# Patient Record
Sex: Male | Born: 1980 | Hispanic: Yes | Marital: Single | Smoking: Never smoker
Health system: Southern US, Community
[De-identification: ages and names within clinical notes are randomized; demographics above are authoritative.]

## PROBLEM LIST (undated history)

## (undated) DIAGNOSIS — E119 Type 2 diabetes mellitus without complications: Secondary | ICD-10-CM

## (undated) HISTORY — DX: Type 2 diabetes mellitus without complications: E11.9

---

## 2015-03-25 ENCOUNTER — Emergency Department
Admission: EM | Admit: 2015-03-25 | Discharge: 2015-03-25 | Disposition: A | Discharge status: Home / Self Care | Payer: Managed Care, Other (non HMO) | Attending: Emergency Medicine | Admitting: Emergency Medicine

## 2015-03-25 ENCOUNTER — Encounter: Payer: Self-pay | Admitting: Emergency Medicine

## 2015-03-25 DIAGNOSIS — Y9389 Activity, other specified: Secondary | ICD-10-CM | POA: Diagnosis not present

## 2015-03-25 DIAGNOSIS — S0532XA Ocular laceration without prolapse or loss of intraocular tissue, left eye, initial encounter: Secondary | ICD-10-CM | POA: Diagnosis not present

## 2015-03-25 DIAGNOSIS — W268XXA Contact with other sharp object(s), not elsewhere classified, initial encounter: Secondary | ICD-10-CM | POA: Diagnosis not present

## 2015-03-25 DIAGNOSIS — H1132 Conjunctival hemorrhage, left eye: Secondary | ICD-10-CM | POA: Insufficient documentation

## 2015-03-25 DIAGNOSIS — Y9269 Other specified industrial and construction area as the place of occurrence of the external cause: Secondary | ICD-10-CM | POA: Insufficient documentation

## 2015-03-25 DIAGNOSIS — S0592XA Unspecified injury of left eye and orbit, initial encounter: Secondary | ICD-10-CM | POA: Diagnosis present

## 2015-03-25 DIAGNOSIS — Y990 Civilian activity done for income or pay: Secondary | ICD-10-CM | POA: Insufficient documentation

## 2015-03-25 MED ORDER — FLUORESCEIN SODIUM 1 MG OP STRP
1.0000 | ORAL_STRIP | Freq: Once | OPHTHALMIC | Status: AC
  Administered 2015-03-25: 21:00:00 via OPHTHALMIC
  Filled 2015-03-25: qty 1

## 2015-03-25 MED ORDER — EYE WASH OPHTH SOLN
2.0000 [drp] | OPHTHALMIC | Status: DC | PRN
  Administered 2015-03-25: 2 [drp] via OPHTHALMIC
  Filled 2015-03-25: qty 118

## 2015-03-25 MED ORDER — TETRACAINE HCL 0.5 % OP SOLN
1.0000 [drp] | Freq: Once | OPHTHALMIC | Status: AC
  Administered 2015-03-25: 1 [drp] via OPHTHALMIC
  Filled 2015-03-25: qty 2

## 2015-03-25 MED ORDER — ERYTHROMYCIN 5 MG/GM OP OINT
1.0000 "application " | TOPICAL_OINTMENT | Freq: Four times a day (QID) | OPHTHALMIC | Status: DC
  Administered 2015-03-25: 1 via OPHTHALMIC
  Filled 2015-03-25: qty 1

## 2015-03-25 MED ORDER — ERYTHROMYCIN 5 MG/GM OP OINT: 1.0000 "application " | TOPICAL_OINTMENT | Freq: Four times a day (QID) | OPHTHALMIC | Status: DC

## 2015-03-25 NOTE — Discharge Instructions (Signed)
Hemorragia subconjuntival (Subconjunctival Hemorrhage) La hemorragia subconjuntival es el sangrado que se produce entre la parte blanca del ojo (esclertica) y la membrana transparente que recubre la parte externa de este rgano (conjuntiva). Cerca de la superficie del ojo hay muchos vasos sanguneos diminutos. La hemorragia subconjuntival ocurre cuando uno o ms de estos vasos sanguneos se rompen y sangran, lo que deriva en la aparicin de una mancha roja en el ojo. Esta es similar a un hematoma. En funcin de la magnitud del sangrado, la mancha roja puede cubrir nicamente una pequea zona del ojo o la totalidad de la parte visible de la esclertica. Si se acumula mucha sangre debajo de la conjuntiva, tambin puede haber inflamacin. Las hemorragias subconjuntivales no afectan la visin ni causan dolor, pero puede haber sensacin de irritacin ocular si hay inflamacin. Generalmente, las hemorragias subconjuntivales no requieren tratamiento y desaparecen solas en el trmino de dos semanas. CAUSAS Esta afeccin puede ser causada por lo siguiente:  Un traumatismo leve, como frotarse los ojos con mucha fuerza.  Un traumatismo grave o una contusin.  Toser, estornudar o vomitar.  Realizar esfuerzos, como ocurre al levantar un objeto pesado.  Hipertensin arterial.  Una ciruga ocular reciente.  Antecedentes de diabetes.  Algunos medicamentos, especialmente los anticoagulantes.  Otras afecciones, como los tumores en los ojos, los trastornos hemorrgicos o las anomalas de los vasos sanguneos. Las hemorragias subconjuntivales pueden producirse sin una causa aparente.  SNTOMAS  Los sntomas de esta afeccin incluyen lo siguiente:  Una mancha de color rojo oscuro o brillante en la parte blanca del ojo.  La zona enrojecida se puede extender hasta cubrir un rea ms grande del ojo antes de desaparecer.  La zona enrojecida puede tornarse de color marrn amarillento antes de  desaparecer.  Hinchazn.  Irritacin leve del ojo. DIAGNSTICO Esta afeccin se diagnostica mediante un examen fsico. Si la hemorragia subconjuntival fue causada por un traumatismo, el mdico puede derivarlo a un oculista (oftalmlogo) o a otro especialista para que lo examinen en busca de otras lesiones. Pueden hacerle otros estudios, por ejemplo:  Un examen ocular.  Un control de la presin arterial.  Anlisis de sangre para detectar la presencia de trastornos hemorrgicos. Si la hemorragia subconjuntival fue causada por un traumatismo, pueden hacerle radiografas o una tomografa computarizada (TC) para determinar si hay otras lesiones. TRATAMIENTO Por lo general, no se necesita tratamiento. El mdico puede recomendarle que se aplique gotas oftlmicas o compresas fras para aliviar las molestias. INSTRUCCIONES PARA EL CUIDADO EN EL HOGAR  Tome los medicamentos de venta libre y los recetados solamente como se lo haya indicado el mdico.  Aplique las gotas oftlmicas o las compresas fras para aliviar las molestias como se lo haya indicado el mdico.  Evite las actividades, las cosas y los entornos que pueden causarle irritacin o lesiones en el ojo.  Concurra a todas las visitas de control como se lo haya indicado el mdico. Esto es importante. SOLICITE ATENCIN MDICA SI:  Siente dolor en el ojo.  El sangrado no desaparece en el trmino de 3semanas.  Sigue teniendo hemorragias subconjuntivales. SOLICITE ATENCIN MDICA DE INMEDIATO SI:  Tiene cambios en la visin o dificultad para ver.  Repentinamente, tiene mucha sensibilidad a la luz.  Tiene dolor de cabeza intenso, vmitos persistentes, confusin o un cansancio que no es normal (letargia).  Parece que el ojo sobresale o se protruye de la rbita.  Le aparecen hematomas en el cuerpo sin motivo.  Tiene sangrado en otra parte del cuerpo sin   motivo.   Esta informacin no tiene Theme park manager el consejo del  mdico. Asegrese de hacerle al mdico cualquier pregunta que tenga.   Document Released: 11/20/2004 Document Revised: 11/01/2014 Elsevier Interactive Patient Education [employer]  Use the prescription eye ointment 4 times a day (every 6 hours) as directed. Call [facility] in the morning to schedule an emergency department follow-up. Take Tylenol or Motrin as needed for pain relief. Wear sunglasses to protect from sunlight. Wear safety glasses at work to prevent eye injury.  ================= Utilice la crema antibiotica para ojos 4 veces al da (cada 6 horas) segn las indicaciones. Llame a [employer] por la maana para programar un seguimiento del departamento de Sports administrator. Tomar Tylenol o Motrin segn sea necesario para el alivio del dolor. Use gafas de sol para protegerse de Secretary/administrator. Use anteojos de seguridad en el trabajo para prevenir lesiones oculares.

## 2015-03-25 NOTE — ED Notes (Signed)
Visual acuities:  OD: 20/30  OS:  20/30

## 2015-03-25 NOTE — ED Provider Notes (Signed)
[facility] Emergency Department Provider Note ____________________________________________  Time seen: 1944  I have reviewed the triage vital signs and the nursing notes.  HISTORY  Chief Complaint  Eye Injury  History limited by Spanish language. Interpreter Markus Daft) present during interview and exam.   HPI Rodney Mccann is a 35 y.o. male visit the ED for evaluation of injury sustained to his left eye this morning. 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 He describes that the nail bounced out of the wood that he was hammering into, and hit him in the outside corner of the left eye. He does admit that he was not wearing his safety glasses at the time of the accident. Since that time he's had redness to the left eye and did note some blood tinged tears this morning. He reports pain and foreign body sensation to the lateral aspect of the left eye. He denies any significant vision change, nausea, vomiting, or dizziness.He describes left eye discomfort at an 8/10 in triage.  History reviewed. No pertinent past medical history.  There are no active problems to display for this patient.  History reviewed. No pertinent past surgical history.  Current Outpatient Rx  Name  Route  Sig  Dispense  Refill  . erythromycin ophthalmic ointment   Right Eye   Place 1 application into the right eye 4 (four) times daily.   3.5 g   0    Allergies Review of patient's allergies indicates not on file.  No family history on file.  Social History Social History  Substance Use Topics  . Smoking status: Never Smoker   . Smokeless tobacco: Never Used  . Alcohol Use: No   Review of Systems  Constitutional: Negative for fever. Eyes: Negative for visual changes. Left eye foreign body sensation.  ENT: Negative for sore throat. Cardiovascular: Negative for chest pain. Respiratory: Negative for shortness of  breath. Gastrointestinal: Negative for abdominal pain, vomiting and diarrhea. Genitourinary: Negative for dysuria. Musculoskeletal: Negative for back pain. Skin: Negative for rash. Neurological: Negative for headaches, focal weakness or numbness. ____________________________________________  PHYSICAL EXAM:  VITAL SIGNS: ED Triage Vitals  Enc Vitals Group     BP 03/25/15 1938 146/105 mmHg     Pulse Rate 03/25/15 1938 89     Resp 03/25/15 1938 18     Temp 03/25/15 1938 97.7 F (36.5 C)     Temp Source 03/25/15 1938 Oral     SpO2 03/25/15 1938 98 %     Weight 03/25/15 1938 159 lb (72.122 kg)     Height 03/25/15 1938  (1.626 m)     Head Cir --      Peak Flow --      Pain Score 03/25/15 1939 8     Pain Loc --      Pain Edu? --      Excl. in GC? --    Constitutional: Alert and oriented. Well appearing and in no distress. Head: Normocephalic and atraumatic.      Eyes: Conjunctivae are injected on the left. PERRL. Normal extraocular movements bilaterally. Normal funduscopic exam on the left. Left eye with an obvious lateral subconjunctival hemorrhage on exam. Patient with some dried blood noted to the upper or lower lids. There is dye uptake to the lateral limbic border at about the 3:00 position. This dye uptake falls in a curvilinear pattern following the limbus. There is no dye uptake overlying the cornea.  There is no appreciable waterfall sign.       Ears: Canals clear. TMs intact bilaterally.   Nose: No congestion/rhinorrhea.   Mouth/Throat: Mucous membranes are moist.   Neck: Supple. No thyromegaly. Hematological/Lymphatic/Immunological: No cervical lymphadenopathy. Cardiovascular: Normal rate, regular rhythm.  Respiratory: Normal respiratory effort. Musculoskeletal: Nontender with normal range of motion in all extremities.  Neurologic:  Normal gait without ataxia. Normal speech and language. No gross focal neurologic deficits are appreciated. Skin:  Skin is warm,  dry and intact. No rash noted. Psychiatric: Mood and affect are normal. Patient exhibits appropriate insight and judgment. ____________________________________________  PROCEDURES  Visual Acuity  Right Eye Distance:  20/30 Left Eye Distance:  20/30 ____________________________________________  INITIAL IMPRESSION / ASSESSMENT AND PLAN / ED COURSE  Patient with an acute left corneal laceration and some conjunctival hemorrhage.   I spoke with Dr. Marcy Panning, she agrees with the treatment plan utilizing erythromycin ointment and close follow-up tomorrow in the office.  Patient is discharged with eye injury precautions including use of sunglasses and protective eyewear for any work activities. He is advised to follow with [facility] tomorrow morning for recheck as discussed. He dose Tylenol or ibuprofen over-the-counter for intermittent pain relief. ____________________________________________  FINAL CLINICAL IMPRESSION(S) / ED DIAGNOSES  Final diagnoses:  Corneal laceration of left eye, initial encounter  Subconjunctival hematoma, left      Lissa Hoard, PA-C 03/25/15 2044  Jennye Moccasin, MD 03/25/15 2056

## 2015-03-25 NOTE — ED Notes (Signed)
AAOx3.  Skin warm and dry.  NAD 

## 2015-03-25 NOTE — ED Notes (Signed)
AAOx3.  Skin warm and dry.   

## 2015-03-25 NOTE — ED Notes (Signed)
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  Sclera of left eye reddened.  Good ocular movement and equal with Right.  C/o pain to left eye

## 2015-05-20 ENCOUNTER — Emergency Department
Admission: EM | Admit: 2015-05-20 | Discharge: 2015-05-20 | Disposition: A | Discharge status: Home / Self Care | Payer: Managed Care, Other (non HMO) | Attending: Emergency Medicine | Admitting: Emergency Medicine

## 2015-05-20 ENCOUNTER — Encounter: Payer: Self-pay | Admitting: *Deleted

## 2015-05-20 DIAGNOSIS — R531 Weakness: Secondary | ICD-10-CM | POA: Insufficient documentation

## 2015-05-20 LAB — BASIC METABOLIC PANEL
Anion gap: 4 — ABNORMAL LOW (ref 5–15)
BUN: 13 mg/dL (ref 6–20)
CHLORIDE: 103 mmol/L (ref 101–111)
CO2: 29 mmol/L (ref 22–32)
Calcium: 8.9 mg/dL (ref 8.9–10.3)
Creatinine, Ser: 0.76 mg/dL (ref 0.61–1.24)
GFR calc Af Amer: >60 mL/min (ref >60)
GFR calc non Af Amer: >60 mL/min (ref >60)
GLUCOSE: 100 mg/dL — AB (ref 65–99)
POTASSIUM: 3.7 mmol/L (ref 3.5–5.1)
Sodium: 136 mmol/L (ref 135–145)

## 2015-05-20 LAB — URINALYSIS COMPLETE WITH MICROSCOPIC (ARMC ONLY)
BACTERIA UA: NONE SEEN
Bilirubin Urine: NEGATIVE
Glucose, UA: NEGATIVE mg/dL
HGB URINE DIPSTICK: NEGATIVE
Ketones, ur: NEGATIVE mg/dL
LEUKOCYTES UA: NEGATIVE
NITRITE: NEGATIVE
PH: 6 (ref 5.0–8.0)
Protein, ur: NEGATIVE mg/dL
RBC / HPF: NONE SEEN RBC/hpf (ref 0–5)
Specific Gravity, Urine: 1.001 — ABNORMAL LOW (ref 1.005–1.030)
Squamous Epithelial / LPF: NONE SEEN
WBC UA: NONE SEEN WBC/hpf (ref 0–5)

## 2015-05-20 LAB — CBC
HEMATOCRIT: 43.3 % (ref 40.0–52.0)
HEMOGLOBIN: 15.1 g/dL (ref 13.0–18.0)
MCH: 30.4 pg (ref 26.0–34.0)
MCHC: 34.9 g/dL (ref 32.0–36.0)
MCV: 87.2 fL (ref 80.0–100.0)
Platelets: 204 10*3/uL (ref 150–440)
RBC: 4.97 MIL/uL (ref 4.40–5.90)
RDW: 13 % (ref 11.5–14.5)
WBC: 9 10*3/uL (ref 3.8–10.6)

## 2015-05-20 MED ORDER — SODIUM CHLORIDE 0.9 % IV BOLUS (SEPSIS)
1000.0000 mL | Freq: Once | INTRAVENOUS | Status: AC
  Administered 2015-05-20: 1000 mL via INTRAVENOUS

## 2015-05-20 NOTE — ED Provider Notes (Signed)
[facility] Emergency Department Provider Note  ____________________________________________  Time seen: Approximately 4:03 AM  I have reviewed the triage vital signs and the nursing notes.   HISTORY  Chief Complaint Weakness  History obtained via Spanish interpreter  HPI Solmon Bohr is a 35 y.o. male who presents to the ED from home with a chief complaint of weakness and feeling lightheaded. Patient denies past medical history, had several teeth extracted 2 days ago, is taking ibuprofen and Vicodin for his pain. Reports approximately midnight he was laying in his bed and felt lightheaded as if he were going to pass out. States he got up and tried exercise, drink water and juice. Overall he is feeling better now compared to prior to arrival. Denies recent fever, chills, chest pain, shortness of breath, abdominal pain, nausea, vomiting, diarrhea. Denies recent travel or trauma.   Past medical history None  There are no active problems to display for this patient.   History reviewed. No pertinent past surgical history.  Current Outpatient Rx  Name  Route  Sig  Dispense  Refill  . erythromycin ophthalmic ointment   Right Eye   Place 1 application into the right eye 4 (four) times daily.   3.5 g   0     Allergies Review of patient's allergies indicates no known allergies.  History reviewed. No pertinent family history.  Social History Social History  Substance Use Topics  . Smoking status: Never Smoker   . Smokeless tobacco: Never Used  . Alcohol Use: Yes     Comment: occasionally    Review of Systems  Constitutional: Positive for generalized weakness. No fever/chills. Eyes: No visual changes. ENT: No sore throat. Cardiovascular: Denies chest pain. Respiratory: Denies shortness of breath. Gastrointestinal: No abdominal pain.  No nausea, no vomiting.  No diarrhea.  No constipation. Genitourinary: Negative for  dysuria. Musculoskeletal: Negative for back pain. Skin: Negative for rash. Neurological: Negative for headaches, focal weakness or numbness.  10-point ROS otherwise negative.  ____________________________________________   PHYSICAL EXAM:  VITAL SIGNS: ED Triage Vitals  Enc Vitals Group     BP 05/20/15 0126 141/83 mmHg     Pulse Rate 05/20/15 0126 83     Resp 05/20/15 0126 20     Temp 05/20/15 0126 98.4 F (36.9 C)     Temp Source 05/20/15 0126 Oral     SpO2 05/20/15 0126 96 %     Weight 05/20/15 0126 162 lb (73.483 kg)     Height 05/20/15 0126  (1.626 m)     Head Cir --      Peak Flow --      Pain Score 05/20/15 0127 0     Pain Loc --      Pain Edu? --      Excl. in GC? --     Constitutional: Alert and oriented. Well appearing and in no acute distress. Eyes: Conjunctivae are normal. PERRL. EOMI. Head: Atraumatic. Nose: No congestion/rhinnorhea. Mouth/Throat: Mucous membranes are moist.  Oropharynx non-erythematous.  No intraoral/extraoral swelling or gum abscess. Neck: No stridor.  No carotid bruits. Cardiovascular: Normal rate, regular rhythm. Grossly normal heart sounds.  Good peripheral circulation. Respiratory: Normal respiratory effort.  No retractions. Lungs CTAB. Gastrointestinal: Soft and nontender. No distention. No abdominal bruits. No CVA tenderness. Musculoskeletal: No lower extremity tenderness nor edema.  No joint effusions. Neurologic:  Normal speech and language. No gross focal neurologic deficits are appreciated. No gait instability. Skin:  Skin is warm, dry  and intact. No rash noted. Psychiatric: Mood and affect are normal. Speech and behavior are normal.  ____________________________________________   LABS (all labs ordered are listed, but only abnormal results are displayed)  Labs Reviewed  BASIC METABOLIC PANEL - Abnormal; Notable for the following:    Glucose, Bld 100 (*)    Anion gap 4 (*)    All other components within normal limits   URINALYSIS COMPLETEWITH MICROSCOPIC ([facility] ONLY) - Abnormal; Notable for the following:    Color, Urine COLORLESS (*)    APPearance CLEAR (*)    Specific Gravity, Urine 1.001 (*)    All other components within normal limits  CBC  CBG MONITORING, ED   ____________________________________________  EKG  ED ECG REPORT I, SUNG,JADE J, the attending physician, personally viewed and interpreted this ECG.   Date: 05/20/2015  EKG Time: 0143  Rate: 77  Rhythm: normal EKG, normal sinus rhythm  Axis: Normal  Intervals:none  ST&T Change: Nonspecific  ____________________________________________  RADIOLOGY  None ____________________________________________   PROCEDURES  Procedure(s) performed: None  Critical Care performed: No  ____________________________________________   INITIAL IMPRESSION / ASSESSMENT AND PLAN / ED COURSE  Pertinent labs & imaging results that were available during my care of the patient were reviewed by me and considered in my medical decision making (see chart for details).  35 year old male otherwise healthy male who presents with generalized weakness. No focal neurological deficits on exam. Orthostatics noted. His lips appear a little dry, and in the setting of recent dental extraction, he is likely a little volume depleted. Will infuse 1 L IV normal saline.  ----------------------------------------- 6:37 AM on 05/20/2015 -----------------------------------------  Patient sleeping in no acute distress. IV fluids completed. Overall feels much better. Strict return precautions given. Patient verbalizes understanding and agrees with plan of care. ____________________________________________   FINAL CLINICAL IMPRESSION(S) / ED DIAGNOSES  Final diagnoses:  Weakness      Irean HongJade J Sung, MD 05/20/15 [phone]

## 2015-05-20 NOTE — ED Notes (Signed)
Pt states he felt weak while he was laying in bed, as if he were going to pass out x 1 hr ago. Pt states he got up and tried to exercise, drank water, and juice. Pt states he is feeling better now. Pt had several teeth pulled on Thursday and is taking ibuprofen and vicoden for his pain. Pt states he has felt this way before x 2 months ago, did not seek medical care at that time. Pt states he lost his balance and strength.

## 2015-05-20 NOTE — Discharge Instructions (Signed)
1. Drink plenty of fluids daily. 2. If your pain is not severe, I recommend you taking Tylenol only for dental discomfort. 3. Return to the ER for worsening symptoms, persistent vomiting, difficulty breathing or other concerns.  Debilidad  (Weakness)  La debilidad es la falta de energa. Se puede sentir en todo el cuerpo (generalizada) o en una parte especfica del cuerpo (focalizada).Verner Chol. Algunas causas de la debilidad pueden ser graves. Es posible que necesite una evaluacin mdica ms profunda, especialmente si usted es anciano o tiene historia de inmunosupresin (como quimioterapia o VIH), enfermedad renal, enfermedad cardiaca o diabetes. CAUSAS  La debilidad puede deberse a muchas afecciones diferentes:   Infecciones.  Cansancio fsico extremo.  Hemorragia interna u otras prdidas de sangre que causa un dficit de glbulos rojos (anemia).  Deshidratacin. Esta causa es ms frecuente en personas mayores.  Efectos secundarios o anormalidades electrolticas por medicamentos, como analgsicos o sedantes.  Estrs emocional, ansiedad o depresin.  Problemas de circulacin, especialmente enfermedad arterial perifrica grave.  Enfermedades cardacas, como la fibrilacin auricular rpida, la bradicardia o la insuficiencia cardaca.  Los trastornos del [address] Stre[address]istema nervioso, como el sndrome de [place]Guillain-Barr, esclerosis mltiple o ictus. DIAGNSTICO  Para hallar la causa de la debilidad, el mdico le har una historia clnica y un examen fsico. Si es necesario, le indicarn pruebas de laboratorio o Recruitment consultantradiografas.  TRATAMIENTO  El tratamiento de la debilidad depende de la causa de sus sntomas y puede Electrical engineervariar mucho.  INSTRUCCIONES PARA EL CUIDADO EN EL HOGAR   Descanse todo lo que sea necesario.  Consuma una dieta bien balanceada.  Trate de Materials engineerhacer ejercicio CarMaxtodos los das.  Tome slo medicamentos de venta libre o recetados, segn las indicaciones del mdico. SOLICITE ATENCIN MDICA SI:    La debilidad parece empeorar o se extiende a otras partes del cuerpo.  Siente nuevos dolores o dolencias. SOLICITE ATENCIN MDICA DE INMEDIATO SI:   No puede realizar sus actividades normales diarias, como vestirse y alimentarse.  No puede subir y Architectural technologistbajar escaleras o se siente agotado cuando lo hace.  Tiene dificultad para respirar o le duele pecho.  Tiene dificultad para mover partes del cuerpo.  Siente debilidad en una sola zona del cuerpo o solo en un lado del cuerpo.  Tiene fiebre.  Tiene problemas para hablar o tragar.  No puede controlar la vejiga o los movimientos intestinales.  La materia fecal o el vmito son negros o Investment banker, operationaltienen sangre. ASEGRESE DE QUE:   Comprende estas instrucciones.  Controlar su enfermedad.  Solicitar ayuda de inmediato si no mejora o si empeora.   Esta informacin no tiene Theme park managercomo fin reemplazar el consejo del mdico. Asegrese de hacerle al mdico cualquier pregunta que tenga.   Document Released: 03/24/2006 Document Revised: 08/12/2011 Elsevier Interactive Patient Education [employer] Elsevier Inc.

## 2015-07-03 LAB — HM HIV SCREENING LAB: HM HIV Screening: NEGATIVE

## 2015-11-26 DIAGNOSIS — L210 Seborrhea capitis: Secondary | ICD-10-CM | POA: Insufficient documentation

## 2015-11-26 DIAGNOSIS — E781 Pure hyperglyceridemia: Secondary | ICD-10-CM | POA: Insufficient documentation

## 2017-09-22 ENCOUNTER — Ambulatory Visit (HOSPITAL_COMMUNITY)
Admission: EM | Admit: 2017-09-22 | Discharge: 2017-09-22 | Discharge status: Absent without leave | Payer: Managed Care, Other (non HMO)

## 2017-10-26 ENCOUNTER — Other Ambulatory Visit: Payer: Self-pay

## 2017-10-26 ENCOUNTER — Emergency Department: Payer: 59

## 2017-10-26 ENCOUNTER — Emergency Department
Admission: EM | Admit: 2017-10-26 | Discharge: 2017-10-26 | Disposition: A | Discharge status: Home / Self Care | Payer: 59 | Attending: Emergency Medicine | Admitting: Emergency Medicine

## 2017-10-26 DIAGNOSIS — R1310 Dysphagia, unspecified: Secondary | ICD-10-CM

## 2017-10-26 DIAGNOSIS — J392 Other diseases of pharynx: Secondary | ICD-10-CM

## 2017-10-26 DIAGNOSIS — R682 Dry mouth, unspecified: Secondary | ICD-10-CM | POA: Insufficient documentation

## 2017-10-26 LAB — GLUCOSE, CAPILLARY: GLUCOSE-CAPILLARY: 111 mg/dL — AB (ref 70–99)

## 2017-10-26 LAB — GROUP A STREP BY PCR: GROUP A STREP BY PCR: NOT DETECTED

## 2017-10-26 IMAGING — CR DG NECK SOFT TISSUE
1 series · 2 of 2 positions shown · non-contrast
Comparison: None.

CLINICAL DATA: Dry mouth, unable to swallow.

EXAM:
NECK SOFT TISSUES - 1+ VIEW

[Series 1: dg neck soft tissue · 0.14mm/px · 2 of 2 slices shown]
[im 1/2]
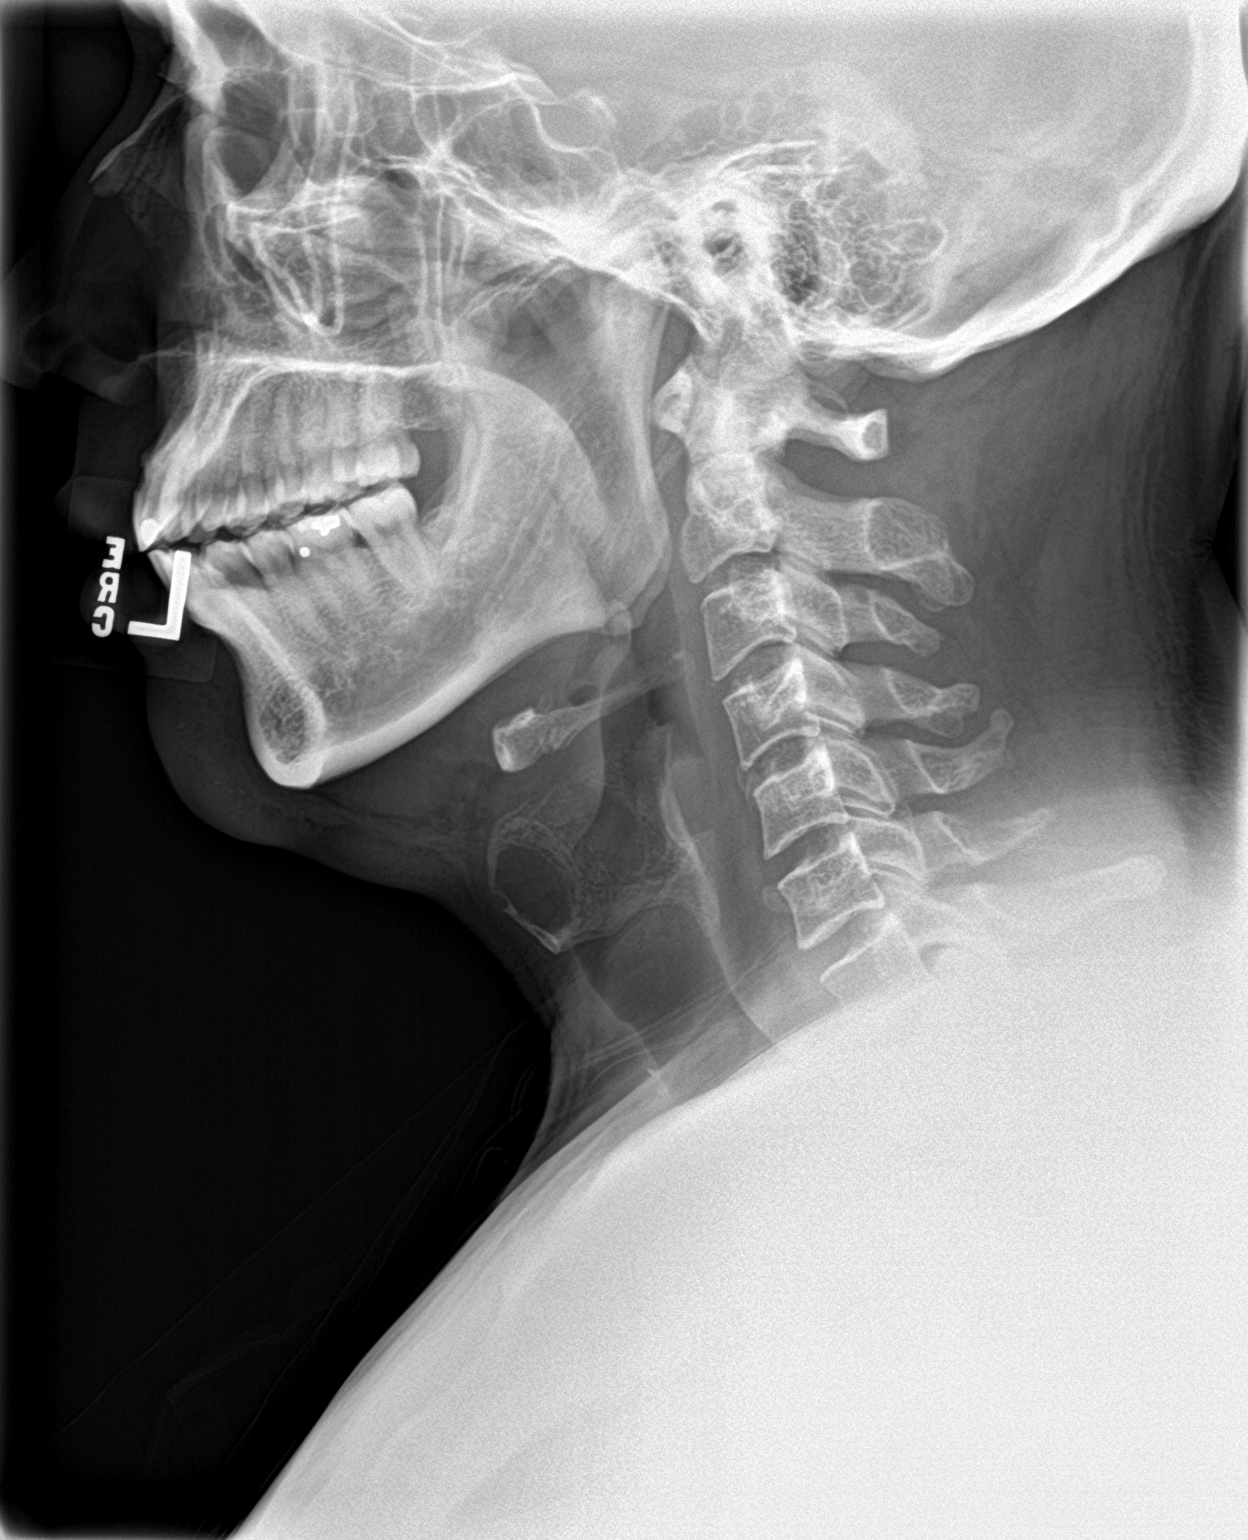
[im 2/2]
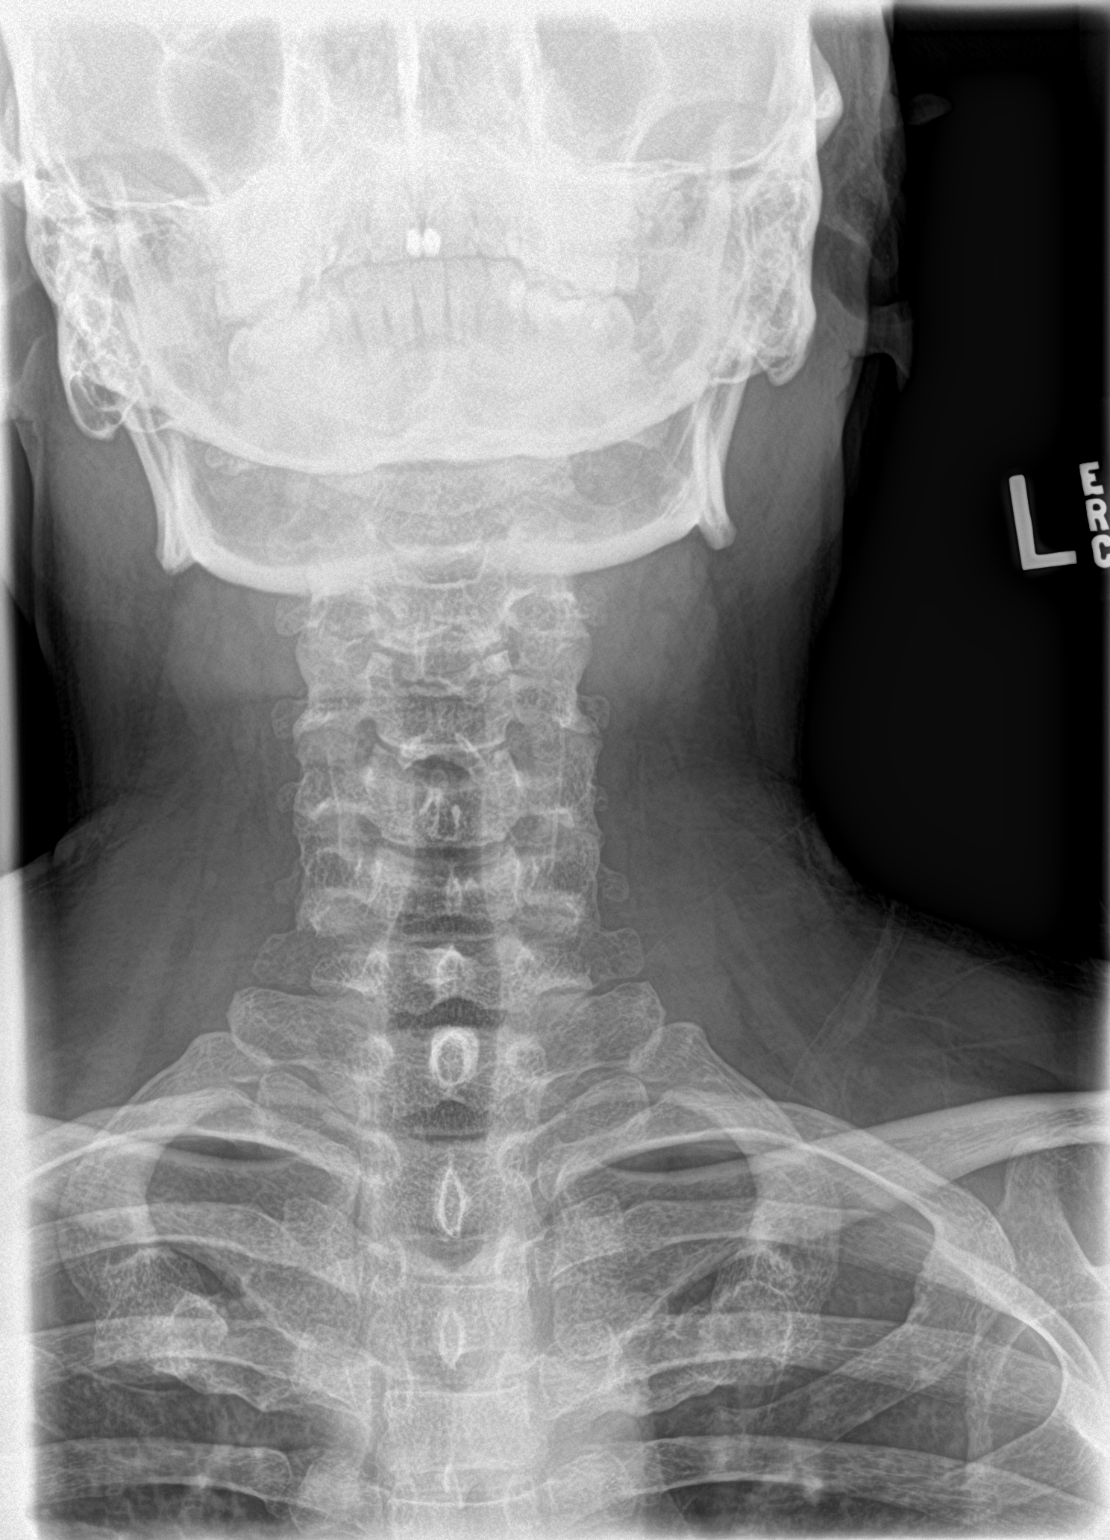

[2 of 2 positions shown; findings below may reference images not displayed]

FINDINGS: There is no evidence of retropharyngeal soft tissue swelling or
epiglottic enlargement. Prominent submandibular fat planes without
subcutaneous emphysema. The cervical airway is unremarkable and no
radio-opaque foreign body identified.
IMPRESSION: Negative.

## 2017-10-26 NOTE — ED Triage Notes (Signed)
Info obtained via [employer], [place] [phone]).    Patient reports was at friends house tonight and the ambulance was called because his mouth went dry and couldn't swallow and then panicked and was afraid I might die.  Patient is speaking to interpreter in complete sentences without difficulty.  Patient reports being afraid to sleep because afraid he will choke.

## 2017-10-26 NOTE — ED Provider Notes (Signed)
[facility] Emergency Department Provider Note  ____________________________________________   First MD Initiated Contact with Patient 10/26/17 [phone]     (approximate)  I have reviewed the triage vital signs and the nursing notes.   HISTORY  Chief Complaint No chief complaint on file.  The patient and/or family speak(s) Spanish.  They understand they have the right to the use of a hospital interpreter, however at this time they prefer to speak directly with me in Spanish.  They know that they can ask for an interpreter at any time.   HPI Rodney Mccann is a 37 y.o. male with no chronic medical issues who presents for evaluation of intermittent feelings of difficulty swallowing and dry mouth and throat over the last several months.  He states that nothing in particular makes his symptoms better or worse.  He states that sometimes for no apparent reason he feels like he cannot swallow.  This happens after he is doing his job in [employer] but it can also happen at random at night.  He feels no swelling.  It does not seem to matter if he drinks plenty of fluids or not but when the symptom is present he feels like he cannot drink.  He does not have any difficulty speaking or breathing.  I makes him very afraid because he feels like he might die.  He reports that it happened earlier tonight so that is why he came to the emergency department.  He is asymptomatic at this time.  He does state that he went to [facility] and saw a doctor to have a general health check but no specific issues were identified.  He denies headache, neck pain, sore throat, nausea, vomiting, chest pain, shortness of breath, abdominal pain, and dysuria.  No past medical history on file.  There are no active problems to display for this patient.   No past surgical history on file.  Prior to Admission medications   Medication Sig Start Date End Date Taking? Authorizing Provider    erythromycin ophthalmic ointment Place 1 application into the right eye 4 (four) times daily. 03/25/15   Menshew, Charlesetta Ivory, PA-C    Allergies Patient has no known allergies.  No family history on file.  Social History Social History   Tobacco Use  . Smoking status: Never Smoker  . Smokeless tobacco: Never Used  Substance Use Topics  . Alcohol use: Yes    Comment: occasionally  . Drug use: No    Review of Systems Constitutional: No fever/chills Eyes: No visual changes. ENT: Feelings of dry throat and difficulty swallowing intermittently over the last month as described above.  No swelling or sensation of sore throat. Cardiovascular: Denies chest pain. Respiratory: Denies shortness of breath. Gastrointestinal: No abdominal pain.  No nausea, no vomiting.  No diarrhea.  No constipation. Genitourinary: Negative for dysuria. Musculoskeletal: Negative for neck pain.  Negative for back pain. Integumentary: Negative for rash. Neurological: Negative for headaches, focal weakness or numbness.   ____________________________________________   PHYSICAL EXAM:  VITAL SIGNS: ED Triage Vitals  Enc Vitals Group     BP 10/26/17 0148 (!) 138/91     Pulse Rate 10/26/17 0148 70     Resp 10/26/17 0148 19     Temp 10/26/17 0148 98.6 F (37 C)     Temp Source 10/26/17 0148 Oral     SpO2 10/26/17 0148 97 %     Weight 10/26/17 0149 70.8 kg (156 lb)  Height 10/26/17 0149 1.626 m (5\' 4" )     Head Circumference --      Peak Flow --      Pain Score 10/26/17 0148 0     Pain Loc --      Pain Edu? --      Excl. in [place]? --     Constitutional: Alert and oriented. Well appearing and in no acute distress. Eyes: Conjunctivae are normal.  Head: Atraumatic. Nose: No congestion/rhinnorhea. Mouth/Throat: Mucous membranes are moist.  Oropharynx non-erythematous. Neck: No stridor.  No meningeal signs.  No cervical spine tenderness to palpation.  No palpable lymphadenopathy. Cardiovascular:  Normal rate, regular rhythm. Good peripheral circulation. Grossly normal heart sounds. Respiratory: Normal respiratory effort.  No retractions. Lungs CTAB. Gastrointestinal: Soft and nontender. No distention.  Musculoskeletal: No lower extremity tenderness nor edema. No gross deformities of extremities. Neurologic:  Normal speech and language. No gross focal neurologic deficits are appreciated.  Skin:  Skin is warm, dry and intact. No rash noted. Psychiatric: Mood and affect are a little bit anxious but generally unremarkable.  ____________________________________________   LABS (all labs ordered are listed, but only abnormal results are displayed)  Labs Reviewed  GLUCOSE, CAPILLARY - Abnormal; Notable for the following components:      Result Value   Glucose-Capillary 111 (*)    All other components within normal limits  GROUP A STREP BY PCR  CBG MONITORING, ED   ____________________________________________  EKG  None - EKG not ordered by ED physician ____________________________________________  RADIOLOGY Marylou Mccoy, personally viewed and evaluated these images (plain radiographs) as part of my medical decision making, as well as reviewing the written report by the radiologist.  ED MD interpretation:  No acute abnormalities  Official radiology report(s): Dg Neck Soft Tissue  Result Date: 10/26/2017 CLINICAL DATA:  Dry mouth, unable to swallow. EXAM: NECK SOFT TISSUES - 1+ VIEW COMPARISON:  None. FINDINGS: There is no evidence of retropharyngeal soft tissue swelling or epiglottic enlargement. Prominent submandibular fat planes without subcutaneous emphysema. The cervical airway is unremarkable and no radio-opaque foreign body identified. IMPRESSION: Negative. Electronically Signed   By: Awilda Metro M.D.   On: 10/26/2017 03:49    ____________________________________________   PROCEDURES  Critical Care performed: No   Procedure(s) performed:    Procedures   ____________________________________________   INITIAL IMPRESSION / ASSESSMENT AND PLAN / ED COURSE  As part of my medical decision making, I reviewed the following data within the electronic MEDICAL RECORD NUMBER Nursing notes reviewed and incorporated, Labs reviewed  and Radiograph reviewed     Differential diagnosis includes, but is not limited to, panic attack/anxiety, pharyngitis, esophageal web, achalasia.  However by far I think that panic attacks and anxiety are the most likely reason he is having difficulties.  I checked a finger stick blood sugar to make sure that he does not have hyperglycemia and it was within normal limits.  Radiographs of the neck did not demonstrate any structural abnormalities or swelling, and the strep swab was negative.  I provided reassurance but explained to the patient that I am unlikely to be able to diagnose an abnormality such as this that is been going on for several months here in the emergency department.  I encourage close outpatient follow-up with ENT who might have additional ideas recommendations.  I gave my usual and customary return precautions.     ____________________________________________  FINAL CLINICAL IMPRESSION(S) / ED DIAGNOSES  Final diagnoses:  Dry throat  Dysphagia, unspecified type  MEDICATIONS GIVEN DURING THIS VISIT:  Medications - No data to display   ED Discharge Orders    None       Note:  This document was prepared using Dragon voice recognition software and may include unintentional dictation errors.    Loleta Rose, MD 10/26/17 [phone]

## 2017-10-26 NOTE — Discharge Instructions (Signed)
Your workup in the Emergency Department today was reassuring.  We did not find any specific abnormalities.  We recommend you drink plenty of fluids, take your regular medications and/or any new ones prescribed today, and follow up with the doctor(s) listed in these documents as recommended.  Return to the Emergency Department if you develop new or worsening symptoms that concern you.  

## 2019-04-13 ENCOUNTER — Ambulatory Visit: Payer: 59

## 2019-04-25 ENCOUNTER — Encounter (HOSPITAL_COMMUNITY): Payer: Self-pay

## 2019-04-25 ENCOUNTER — Ambulatory Visit (HOSPITAL_COMMUNITY)
Admission: EM | Admit: 2019-04-25 | Discharge: 2019-04-25 | Disposition: A | Discharge status: Home / Self Care | Payer: 59 | Attending: Family Medicine | Admitting: Family Medicine

## 2019-04-25 ENCOUNTER — Other Ambulatory Visit: Payer: Self-pay

## 2019-04-25 DIAGNOSIS — L409 Psoriasis, unspecified: Secondary | ICD-10-CM | POA: Diagnosis not present

## 2019-04-25 MED ORDER — TRIAMCINOLONE ACETONIDE 0.1 % EX OINT: 1.0000 "application " | TOPICAL_OINTMENT | Freq: Two times a day (BID) | CUTANEOUS | 1 refills | Status: DC

## 2019-04-25 MED ORDER — CLOBETASOL PROPIONATE 0.05 % EX GEL: CUTANEOUS | 0 refills | Status: AC

## 2019-04-25 NOTE — ED Provider Notes (Signed)
[place]    CSN: [id number] Arrival date & time: 04/25/19  1533      History   Chief Complaint Chief Complaint  Patient presents with  . Eczema    HPI Rodney Mccann is a 39 y.o. male.   HPI  Many years of dry skin in his scalp and behind his ears.  Treated many years ago with cold tar.  He did not like this treatment.  He is here for treatment today.  History reviewed. No pertinent past medical history.  There are no problems to display for this patient.   History reviewed. No pertinent surgical history.     Home Medications    Prior to Admission medications   Medication Sig Start Date End Date Taking? Authorizing Provider  clobetasol (TEMOVATE) 0.05 % GEL Use daily for one week then once a week - for scalp 04/25/19   Raylene Everts, MD  erythromycin ophthalmic ointment Place 1 application into the right eye 4 (four) times daily. 03/25/15   Menshew, Dannielle Karvonen, PA-C  triamcinolone ointment (KENALOG) 0.1 % Apply 1 application topically 2 (two) times daily. 04/25/19   Raylene Everts, MD    Family History Family History  Family history unknown: Yes    Social History Social History   Tobacco Use  . Smoking status: Never Smoker  . Smokeless tobacco: Never Used  Substance Use Topics  . Alcohol use: Yes    Comment: occasionally  . Drug use: No     Allergies   Ibuprofen   Review of Systems Review of Systems  Skin: Positive for rash.     Physical Exam Triage Vital Signs ED Triage Vitals  Enc Vitals Group     BP 04/25/19 1624 (!) 106/59     Pulse Rate 04/25/19 1624 70     Resp 04/25/19 1624 18     Temp 04/25/19 1624 98 F (36.7 C)     Temp Source 04/25/19 1624 Oral     SpO2 04/25/19 1624 97 %     Weight --      Height --      Head Circumference --      Peak Flow --      Pain Score 04/25/19 1627 0     Pain Loc --      Pain Edu? --      Excl. in [place]? --    No data found.  Updated Vital Signs BP (!) 106/59 (BP  Location: Right Arm)   Pulse 70   Temp 98 F (36.7 C) (Oral)   Resp 18   SpO2 97%   Visual Acuity Right Eye Distance:   Left Eye Distance:   Bilateral Distance:    Right Eye Near:   Left Eye Near:    Bilateral Near:     Physical Exam Constitutional:      General: He is not in acute distress.    Appearance: He is well-developed and normal weight.  HENT:     Head: Normocephalic and atraumatic.     Mouth/Throat:     Comments: Mask in place Eyes:     Conjunctiva/sclera: Conjunctivae normal.     Pupils: Pupils are equal, round, and reactive to light.  Cardiovascular:     Rate and Rhythm: Normal rate.  Pulmonary:     Effort: Pulmonary effort is normal. No respiratory distress.  Abdominal:     General: There is no distension.     Palpations: Abdomen is soft.  Musculoskeletal:        General: Normal range of motion.     Cervical back: Normal range of motion.  Skin:    General: Skin is warm and dry.     Comments: Thick silvery scales present behind ears and up into scalp  Neurological:     Mental Status: He is alert.  Psychiatric:        Mood and Affect: Mood normal.        Behavior: Behavior normal.      UC Treatments / Results  Labs (all labs ordered are listed, but only abnormal results are displayed) Labs Reviewed - No data to display  EKG   Radiology No results found.  Procedures Procedures (including critical care time)  Medications Ordered in UC Medications - No data to display  Initial Impression / Assessment and Plan / UC Course  I have reviewed the triage vital signs and the nursing notes.  Pertinent labs & imaging results that were available during my care of the patient were reviewed by me and considered in my medical decision making (see chart for details).     Psoriasis.  Patient needs Derm referral if he fails steroid treatment Final Clinical Impressions(s) / UC Diagnoses   Final diagnoses:  Psoriasis of scalp     Discharge  Instructions     There are 2 prescriptions.  One is an ointment to use behind your ears.  The other is a gel to use on her scalp. The tar shampoo (over-the-counter) are also helpful for the scalp rash See a dermatologist in follow-up Your primary care doctor can refer you   ED Prescriptions    Medication Sig Dispense Auth. Provider   triamcinolone ointment (KENALOG) 0.1 % Apply 1 application topically 2 (two) times daily. 15 g Eustace Moore, MD   clobetasol (TEMOVATE) 0.05 % GEL Use daily for one week then once a week - for scalp 60 g Eustace Moore, MD     PDMP not reviewed this encounter.   Eustace Moore, MD 04/25/19 2039

## 2019-04-25 NOTE — ED Triage Notes (Signed)
Pt presents with  Recurrent dry skin patches behind both ears and scalp on head for over a month.

## 2019-04-25 NOTE — Discharge Instructions (Signed)
There are 2 prescriptions.  One is an ointment to use behind your ears.  The other is a gel to use on her scalp. The tar shampoo (over-the-counter) are also helpful for the scalp rash See a dermatologist in follow-up Your primary care doctor can refer you

## 2019-04-26 ENCOUNTER — Ambulatory Visit (HOSPITAL_COMMUNITY)
Admission: EM | Admit: 2019-04-26 | Discharge: 2019-04-26 | Disposition: A | Discharge status: Home / Self Care | Payer: 59 | Attending: Family Medicine | Admitting: Family Medicine

## 2019-04-26 ENCOUNTER — Encounter (HOSPITAL_COMMUNITY): Payer: Self-pay | Admitting: Emergency Medicine

## 2019-04-26 ENCOUNTER — Other Ambulatory Visit: Payer: Self-pay

## 2019-04-26 DIAGNOSIS — R197 Diarrhea, unspecified: Secondary | ICD-10-CM | POA: Insufficient documentation

## 2019-04-26 DIAGNOSIS — R109 Unspecified abdominal pain: Secondary | ICD-10-CM | POA: Diagnosis not present

## 2019-04-26 DIAGNOSIS — R531 Weakness: Secondary | ICD-10-CM | POA: Diagnosis not present

## 2019-04-26 DIAGNOSIS — Z20822 Contact with and (suspected) exposure to covid-19: Secondary | ICD-10-CM | POA: Insufficient documentation

## 2019-04-26 MED ORDER — LOPERAMIDE HCL 2 MG PO CAPS: 2.0000 mg | ORAL_CAPSULE | Freq: Four times a day (QID) | ORAL | 0 refills | Status: DC | PRN

## 2019-04-26 NOTE — ED Provider Notes (Signed)
[place]    CSN: [id number] Arrival date & time: 04/26/19  1027      History   Chief Complaint Chief Complaint  Patient presents with  . Diarrhea    HPI Rodney Mccann is a 39 y.o. male.   HPI   Patient was well yesterday for much of day.  Had dinner that he cooked at home.  No one else in his home is sick.  Couple hours later he started to have abdominal crampy pain and diarrhea.  Crampy pain and watery diarrhea all night.  No nausea or vomiting.  He states he is having some body aches.  No cough cold runny nose symptoms.  No shortness of breath.  No loss of sense of smell or taste.  No known exposure to Covid. Is able to keep down water.  History reviewed. No pertinent past medical history.  There are no problems to display for this patient.   History reviewed. No pertinent surgical history.     Home Medications    Prior to Admission medications   Medication Sig Start Date End Date Taking? Authorizing Provider  clobetasol (TEMOVATE) 0.05 % GEL Use daily for one week then once a week - for scalp 04/25/19   Raylene Everts, MD  loperamide (IMODIUM) 2 MG capsule Take 1 capsule (2 mg total) by mouth 4 (four) times daily as needed for diarrhea or loose stools. 04/26/19   Raylene Everts, MD  triamcinolone ointment (KENALOG) 0.1 % Apply 1 application topically 2 (two) times daily. Patient not taking: Reported on 04/26/2019 04/25/19   Raylene Everts, MD    Family History Family History  Problem Relation Age of Onset  . Diabetes Mother   . Diabetes Father     Social History Social History   Tobacco Use  . Smoking status: Never Smoker  . Smokeless tobacco: Never Used  Substance Use Topics  . Alcohol use: Yes    Comment: occasionally  . Drug use: No     Allergies   Ibuprofen   Review of Systems Review of Systems  Constitutional: Positive for fatigue. Negative for chills and fever.  Gastrointestinal: Positive for abdominal distention,  abdominal pain and diarrhea. Negative for nausea and vomiting.  Musculoskeletal: Positive for myalgias.     Physical Exam Triage Vital Signs ED Triage Vitals  Enc Vitals Group     BP 04/26/19 0857 124/75     Pulse Rate 04/26/19 0857 93     Resp 04/26/19 0857 (!) 22     Temp 04/26/19 0857 98.5 F (36.9 C)     Temp Source 04/26/19 0857 Oral     SpO2 04/26/19 0857 98 %     Weight --      Height --      Head Circumference --      Peak Flow --      Pain Score 04/26/19 0853 10     Pain Loc --      Pain Edu? --      Excl. in [place]? --    No data found.  Updated Vital Signs BP 124/75 (BP Location: Left Arm)   Pulse 93   Temp 98.5 F (36.9 C) (Oral)   Resp (!) 22   SpO2 98%      Physical Exam Constitutional:      General: He is not in acute distress.    Appearance: Normal appearance. He is well-developed. He is not ill-appearing or toxic-appearing.  HENT:  Head: Normocephalic and atraumatic.     Mouth/Throat:     Comments: Mask in place Eyes:     Conjunctiva/sclera: Conjunctivae normal.     Pupils: Pupils are equal, round, and reactive to light.  Cardiovascular:     Rate and Rhythm: Normal rate and regular rhythm.     Heart sounds: Normal heart sounds.  Pulmonary:     Effort: Pulmonary effort is normal. No respiratory distress.     Breath sounds: Normal breath sounds.  Abdominal:     General: There is no distension.     Palpations: Abdomen is soft.     Tenderness: There is no abdominal tenderness. There is no guarding or rebound.     Comments: Active bowel sounds.  Soft.  No guarding or rebound  Musculoskeletal:        General: Normal range of motion.     Cervical back: Normal range of motion.  Skin:    General: Skin is warm and dry.  Neurological:     Mental Status: He is alert.      UC Treatments / Results  Labs (all labs ordered are listed, but only abnormal results are displayed) Labs Reviewed  NOVEL CORONAVIRUS, NAA (HOSP ORDER, SEND-OUT TO REF  LAB; TAT 18-24 HRS)    EKG   Radiology No results found.  Procedures Procedures (including critical care time)  Medications Ordered in UC Medications - No data to display  Initial Impression / Assessment and Plan / UC Course  I have reviewed the triage vital signs and the nursing notes.  Pertinent labs & imaging results that were available during my care of the patient were reviewed by me and considered in my medical decision making (see chart for details).     Likely viral illness.  Remotely could be Covid.  We will do Covid testing, and give diarrhea instructions.  Return if worse instead of better Final Clinical Impressions(s) / UC Diagnoses   Final diagnoses:  Diarrhea, unspecified type     Discharge Instructions     Take one loperamide when you get the medication Take another if needed in four hours This will help with the pain and diarrhea Lots of fluids Check your lab test result in My Chart Cannot return to work until COVID test is negative    ED Prescriptions    Medication Sig Dispense Auth. Provider   loperamide (IMODIUM) 2 MG capsule Take 1 capsule (2 mg total) by mouth 4 (four) times daily as needed for diarrhea or loose stools. 12 capsule Eustace Moore, MD     PDMP not reviewed this encounter.   Eustace Moore, MD 04/26/19 1018

## 2019-04-26 NOTE — ED Triage Notes (Addendum)
Onset  March 1 of diarrhea.  Diarrhea all night.  Feeling hot.  Today his body sore and weak.  Has had 3-4 episodes of diarrhea since getting up and has  abdominal pain

## 2019-04-26 NOTE — Discharge Instructions (Addendum)
Take one loperamide when you get the medication Take another if needed in four hours This will help with the pain and diarrhea Lots of fluids Check your lab test result in My Chart Cannot return to work until COVID test is negative

## 2019-04-28 ENCOUNTER — Ambulatory Visit: Payer: Managed Care, Other (non HMO) | Admitting: Family Medicine

## 2019-04-28 LAB — NOVEL CORONAVIRUS, NAA (HOSP ORDER, SEND-OUT TO REF LAB; TAT 18-24 HRS): SARS-CoV-2, NAA: NOT DETECTED

## 2019-10-02 ENCOUNTER — Encounter (HOSPITAL_COMMUNITY): Payer: Self-pay

## 2019-10-02 ENCOUNTER — Other Ambulatory Visit: Payer: Self-pay

## 2019-10-02 ENCOUNTER — Emergency Department (HOSPITAL_COMMUNITY)
Admission: EM | Admit: 2019-10-02 | Discharge: 2019-10-02 | Disposition: A | Discharge status: Home / Self Care | Payer: 59 | Attending: Emergency Medicine | Admitting: Emergency Medicine

## 2019-10-02 DIAGNOSIS — R05 Cough: Secondary | ICD-10-CM | POA: Insufficient documentation

## 2019-10-02 DIAGNOSIS — Z79899 Other long term (current) drug therapy: Secondary | ICD-10-CM | POA: Diagnosis not present

## 2019-10-02 DIAGNOSIS — U071 COVID-19: Secondary | ICD-10-CM | POA: Diagnosis not present

## 2019-10-02 DIAGNOSIS — R059 Cough, unspecified: Secondary | ICD-10-CM

## 2019-10-02 DIAGNOSIS — R52 Pain, unspecified: Secondary | ICD-10-CM | POA: Diagnosis present

## 2019-10-02 DIAGNOSIS — M7918 Myalgia, other site: Secondary | ICD-10-CM | POA: Diagnosis not present

## 2019-10-02 DIAGNOSIS — R519 Headache, unspecified: Secondary | ICD-10-CM | POA: Insufficient documentation

## 2019-10-02 DIAGNOSIS — Z20822 Contact with and (suspected) exposure to covid-19: Secondary | ICD-10-CM

## 2019-10-02 DIAGNOSIS — R531 Weakness: Secondary | ICD-10-CM | POA: Diagnosis not present

## 2019-10-02 LAB — SARS CORONAVIRUS 2 BY RT PCR (HOSPITAL ORDER, PERFORMED IN ~~LOC~~ HOSPITAL LAB): SARS Coronavirus 2: POSITIVE — AB

## 2019-10-02 LAB — CBC
HCT: 50.3 % (ref 39.0–52.0)
Hemoglobin: 16.9 g/dL (ref 13.0–17.0)
MCH: 30.6 pg (ref 26.0–34.0)
MCHC: 33.6 g/dL (ref 30.0–36.0)
MCV: 91 fL (ref 80.0–100.0)
Platelets: 155 10*3/uL (ref 150–400)
RBC: 5.53 MIL/uL (ref 4.22–5.81)
RDW: 12 % (ref 11.5–15.5)
WBC: 5.1 10*3/uL (ref 4.0–10.5)
nRBC: 0 % (ref 0.0–0.2)

## 2019-10-02 LAB — COMPREHENSIVE METABOLIC PANEL
ALT: 78 U/L — ABNORMAL HIGH (ref 0–44)
AST: 35 U/L (ref 15–41)
Albumin: 4 g/dL (ref 3.5–5.0)
Alkaline Phosphatase: 96 U/L (ref 38–126)
Anion gap: 12 (ref 5–15)
BUN: 8 mg/dL (ref 6–20)
CO2: 24 mmol/L (ref 22–32)
Calcium: 8.6 mg/dL — ABNORMAL LOW (ref 8.9–10.3)
Chloride: 103 mmol/L (ref 98–111)
Creatinine, Ser: 0.83 mg/dL (ref 0.61–1.24)
GFR calc Af Amer: >60 mL/min (ref >60)
GFR calc non Af Amer: >60 mL/min (ref >60)
Glucose, Bld: 88 mg/dL (ref 70–99)
Potassium: 4.1 mmol/L (ref 3.5–5.1)
Sodium: 139 mmol/L (ref 135–145)
Total Bilirubin: 0.5 mg/dL (ref 0.3–1.2)
Total Protein: 7.5 g/dL (ref 6.5–8.1)

## 2019-10-02 LAB — CK: Total CK: 122 U/L (ref 49–397)

## 2019-10-02 MED ORDER — SODIUM CHLORIDE 0.9 % IV BOLUS
500.0000 mL | Freq: Once | INTRAVENOUS | Status: AC
  Administered 2019-10-02: 500 mL via INTRAVENOUS

## 2019-10-02 NOTE — Discharge Instructions (Addendum)
Si tiene covid, voy a llamarte. Si no, no va a recibir [employer] tratamiento con tylenol e ibuprofen por dolor y calenturas.  Da [place] Dear cita con el doctor urinario por evaluacion de la urina.  Regresa a la sala de emergencia si tiene dificultad con respiracion.

## 2019-10-02 NOTE — ED Provider Notes (Signed)
[facility] EMERGENCY DEPARTMENT Provider Note   CSN: [id number] Arrival date & time: 10/02/19  1319     History No chief complaint on file.   Rodney Mccann is a 39 y.o. male presenting for evaluation of body aches, sore throat, chills, generalized weakness.  Patient states for the past 4 days, he has not been feeling well.  He reports generalized body aches and feeling very weak.  He reports a mild sore throat.  He has a nonproductive cough.  He intermittently his right hand into sweats, but denies known fever.  He states his sense of taste and smell is off.  He has not received his Covid vaccines.  He denies sick contacts or contact with known COVID-19 positive person.  He reports no medical problems, takes medications daily.  He has a mild intermittent headache.  No chest pain, shortness of breath, nausea, vomiting, domino pain, urinary symptoms, abnormal bowel movements.  He has been taking ibuprofen and Tylenol with short-lived relief of symptoms.  HPI     History reviewed. No pertinent past medical history.  There are no problems to display for this patient.   History reviewed. No pertinent surgical history.     Family History  Problem Relation Age of Onset  . Diabetes Mother   . Diabetes Father     Social History   Tobacco Use  . Smoking status: Never Smoker  . Smokeless tobacco: Never Used  Substance Use Topics  . Alcohol use: Yes    Comment: occasionally  . Drug use: No    Home Medications Prior to Admission medications   Medication Sig Start Date End Date Taking? Authorizing Provider  clobetasol (TEMOVATE) 0.05 % GEL Use daily for one week then once a week - for scalp 04/25/19   Eustace Moore, MD  loperamide (IMODIUM) 2 MG capsule Take 1 capsule (2 mg total) by mouth 4 (four) times daily as needed for diarrhea or loose stools. 04/26/19   Eustace Moore, MD  triamcinolone ointment (KENALOG) 0.1 % Apply 1 application topically 2  (two) times daily. Patient not taking: Reported on 04/26/2019 04/25/19   Eustace Moore, MD    Allergies    Ibuprofen  Review of Systems   Review of Systems  HENT:       Loss of taste and smell  Respiratory: Positive for cough.   Musculoskeletal: Positive for myalgias.  Neurological: Positive for headaches.    Physical Exam Updated Vital Signs BP (!) 143/85 (BP Location: Left Arm)   Pulse 78   Temp 98.5 F (36.9 C) (Oral)   Resp 16   SpO2 98%   Physical Exam Vitals and nursing note reviewed.  Constitutional:      General: He is not in acute distress.    Appearance: He is well-developed.     Comments: Appears nontoxic  HENT:     Head: Normocephalic and atraumatic.  Cardiovascular:     Rate and Rhythm: Normal rate and regular rhythm.     Pulses: Normal pulses.  Pulmonary:     Effort: Pulmonary effort is normal.  Abdominal:     General: There is no distension.     Palpations: There is no mass.     Tenderness: There is no abdominal tenderness. There is no guarding or rebound.  Musculoskeletal:        General: Normal range of motion.     Cervical back: Normal range of motion.  Skin:    General: Skin  is warm.     Capillary Refill: Capillary refill takes less than 2 seconds.     Findings: No rash.  Neurological:     Mental Status: He is alert and oriented to person, place, and time.     ED Results / Procedures / Treatments   Labs (all labs ordered are listed, but only abnormal results are displayed) Labs Reviewed  SARS CORONAVIRUS 2 BY RT PCR (HOSPITAL ORDER, PERFORMED IN [facility])  CBC  COMPREHENSIVE METABOLIC PANEL  CK    EKG None  Radiology No results found.  Procedures Procedures (including critical care time)  Medications Ordered in ED Medications  sodium chloride 0.9 % bolus 500 mL (500 mLs Intravenous New Bag/Given 10/02/19 1614)    ED Course  I have reviewed the triage vital signs and the nursing notes.  Pertinent labs &  imaging results that were available during my care of the patient were reviewed by me and considered in my medical decision making (see chart for details).    MDM Rules/Calculators/A&P                          Patient presenting for evaluation of 4-day history of generalized weakness, body aches, cough.  He also reports loss of taste and smell.  As such, likely viral illness, likely Covid.  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  Vital signs are stable.  Pulmonary exam is reassuring, sats upper 90s on room air.  No signs of respiratory distress.  Will obtain labs to ensure no signs of significant dehydration or rhabdo.  Will obtain Covid test.  If labs are reassuring, plan for symptomatic treatment at home.  Lab tests overall reassuring.  CK normal.  No sign of significant dehydration.  Covid test is still pending.  Discussed findings with patient.  Discussed likely Covid infection, importance of quarantine.  Discussed monitoring for respiratory symptoms, prompt return with any worsening respiratory status. Additionally, pt states for several years he has been urinating frequently at night, no urinary frequency during the day. This has not changed or worsened recently. Will have pt f/u with urology as needed for further eval.   At this time, patient appears safe for discharge.  Return precautions given.  Patient states understands agrees plan.  Rodney Mccann was evaluated in Emergency Department on 10/02/2019 for the symptoms described in the history of present illness. He was evaluated in the context of the global COVID-19 pandemic, which necessitated consideration that the patient might be at risk for infection with the SARS-CoV-2 virus that causes COVID-19. Institutional protocols and algorithms that pertain to the evaluation of patients at risk for COVID-19 are in a state of rapid change based on information released by regulatory bodies including the CDC and  federal and state organizations. These policies and algorithms were followed during the patient's care in the ED.   Patient's Covid test was positive.  I called patient to discuss results and importance of isolation. discussed he is to tell people he has been in contact with to quarantine.   Final Clinical Impression(s) / ED Diagnoses Final diagnoses:  Suspected COVID-19 virus infection  Cough  Body aches    Rx / DC Orders ED Discharge Orders    None       Alveria Apley, PA-C 10/02/19 2305    Raeford Razor, MD 10/03/19 0006

## 2019-10-02 NOTE — ED Notes (Signed)
Patient verbalizes understanding of discharge instructions. Opportunity for questioning and answers were provided. Armband removed by staff, pt discharged from ED ambulatory.   

## 2019-10-02 NOTE — ED Triage Notes (Signed)
Patient complains of 3 days of body aches, sore throat and chills, taking ibuprofen with some relief. Patient alert and oriented, complains of fatigue

## 2019-10-03 ENCOUNTER — Telehealth: Payer: Self-pay | Admitting: Physician Assistant

## 2019-10-03 ENCOUNTER — Telehealth: Payer: Self-pay | Admitting: Surgery

## 2019-10-03 NOTE — Telephone Encounter (Signed)
ED CM received call via Spanish interpreter, concerning patient receiving a call from the hospital about covid meds. CM noted that patient test covid positive and received a call from the infusion clinic about potential antibodies. CM and interpreter assisted patient in leaving a secured  message for a return call on the hotline.

## 2019-10-03 NOTE — Telephone Encounter (Signed)
Called to discuss with patient about Covid symptoms and the use of bamlanivimab/etesevimab or casirivimab/imdevimab, a monoclonal antibody infusion for those with mild to moderate Covid symptoms and at a high risk of hospitalization due to BMI>25  Message left to call back our hotline [phone].  Cline Crock PA-C  [facility]

## 2019-10-04 ENCOUNTER — Telehealth: Payer: Self-pay | Admitting: Physician Assistant

## 2019-10-04 NOTE — Telephone Encounter (Addendum)
Called to discuss with Lala Lund about Covid symptoms and the use of bamlanivimab/etesevimab or casirivimab/imdevimab, a monoclonal antibody infusion for those with mild to moderate Covid symptoms and at a high risk of hospitalization.     Pt is qualified for this infusion at the monoclonal antibody infusion center due to co-morbid conditions and/or a member of an at-risk group (BMI >25), however but would like to think more about infusion at this time. Symptoms tier reviewed as well as criteria for ending isolation.  Symptoms reviewed that would warrant ED/Hospital evaluation. Preventative practices reviewed. Patient verbalized understanding. Patient advised to call back if he decides that he does want to get infusion. Callback number to the infusion center given. Patient advised to go to Urgent care or ED with severe symptoms. Last date pt would be eligible for infusion is 8/16.   He would like to call his insurance company to see how much they will cover first.   Cline Crock PA-C

## 2019-10-05 ENCOUNTER — Other Ambulatory Visit: Payer: Self-pay | Admitting: Infectious Diseases

## 2019-10-05 ENCOUNTER — Other Ambulatory Visit: Payer: Self-pay

## 2019-10-05 ENCOUNTER — Emergency Department (HOSPITAL_COMMUNITY)
Admission: EM | Admit: 2019-10-05 | Discharge: 2019-10-05 | Disposition: A | Discharge status: Home / Self Care | Payer: 59

## 2019-10-05 DIAGNOSIS — U071 COVID-19: Secondary | ICD-10-CM

## 2019-10-05 DIAGNOSIS — Z6825 Body mass index (BMI) 25.0-25.9, adult: Secondary | ICD-10-CM

## 2019-10-05 MED ORDER — SODIUM CHLORIDE 0.9 % IV SOLN
1200.0000 mg | Freq: Once | INTRAVENOUS | Status: AC
  Administered 2019-10-06: 1200 mg via INTRAVENOUS
  Filled 2019-10-05: qty 400

## 2019-10-05 NOTE — Telephone Encounter (Signed)
After patient presented to ER for MAB treamtent I called him to set up infusion at [facility]    The following was discussed with the patient including timing of vaccination 90 days after infusion treatment (November 10th, 2021).     The address for the infusion clinic site is:  --GPS address is [address] - the parking is located near [employer] building where you will see  COVID19 Infusion feather banner marking the entrance to parking.   (see photos below)                --Enter into the 2nd entrance where the "wave, flag banner" is at the road. Turn into this 2nd entrance and immediately turn left to park in 1 of the 5 parking spots. Please stay in your car and call the desk for assistance inside [phone].   --Average time in department is roughly 3 hours for Regeneron treatment - this includes preparation of the medication, IV start and the required 1 hour monitoring after the infusion.    Should you develop worsening shortness of breath, chest pain or severe breathing problems please do not wait for this appointment and go to the Emergency room for evaluation and treatment.   The day of your visit you should: Marland Kitchen Get plenty of rest the night before and drink plenty of water . Eat a light meal/snack before coming and take your medications as prescribed  . Wear warm, comfortable clothes with a shirt that can roll-up over the elbow (will need IV start).  . Wear a mask  . Consider bringing some activity to help pass the time  We are starting to see some insurances bill for the administration of the medication - we are learning more information but you may receive a bill after your appointment. It has ranged from $300-640. We can get you in touch with Customer Service team for billing to help if you incur a bill.   I hope this helps find you feeling better,  Rexene Alberts, NP

## 2019-10-05 NOTE — ED Provider Notes (Cosign Needed)
Patient placed in Quick Look pathway, seen and evaluated   Chief Complaint: Came to ED for COVID infusion after receiving a call from a PA stating he qualified.   HPI:   Pt here under the impression he is here for a COVID infusion after testing positive for COVID several days ago. Denies CP, SOB. States he spoke with a PA who told him to come to ED     ROS: No CP, SOB,  No headache  Physical Exam:   Gen: No distress  Neuro: Awake and Alert  Skin: Warm     Focused Exam: Respirations equal and unlabored, patient able to speak in full sentences    Initiation of care has begun. The patient has been counseled on the process, plan, and necessity for staying for the completion/evaluation, and the remainder of the medical screening examination    Mare Ferrari, PA-C 10/05/19 1326

## 2019-10-05 NOTE — ED Notes (Addendum)
Contacted infusion clinic provider and given patient name and cell phone number who will call patient and schedule infusion clinic time. Patient states does not want to be seen in the ED and wants to go to the infusion clinic. Nurse used iPad language app to communicate with patient.

## 2019-10-06 ENCOUNTER — Ambulatory Visit (HOSPITAL_COMMUNITY)
Admission: RE | Admit: 2019-10-06 | Discharge: 2019-10-06 | Disposition: A | Discharge status: Home / Self Care | Payer: 59 | Source: Ambulatory Visit | Attending: Pulmonary Disease | Admitting: Pulmonary Disease

## 2019-10-06 DIAGNOSIS — Z6825 Body mass index (BMI) 25.0-25.9, adult: Secondary | ICD-10-CM | POA: Diagnosis present

## 2019-10-06 DIAGNOSIS — U071 COVID-19: Secondary | ICD-10-CM | POA: Insufficient documentation

## 2019-10-06 MED ORDER — FAMOTIDINE IN NACL 20-0.9 MG/50ML-% IV SOLN: 20.0000 mg | Freq: Once | INTRAVENOUS | Status: DC | PRN

## 2019-10-06 MED ORDER — EPINEPHRINE 0.3 MG/0.3ML IJ SOAJ: 0.3000 mg | Freq: Once | INTRAMUSCULAR | Status: DC | PRN

## 2019-10-06 MED ORDER — ALBUTEROL SULFATE HFA 108 (90 BASE) MCG/ACT IN AERS: 2.0000 | INHALATION_SPRAY | Freq: Once | RESPIRATORY_TRACT | Status: DC | PRN

## 2019-10-06 MED ORDER — DIPHENHYDRAMINE HCL 50 MG/ML IJ SOLN: 50.0000 mg | Freq: Once | INTRAMUSCULAR | Status: DC | PRN

## 2019-10-06 MED ORDER — SODIUM CHLORIDE 0.9 % IV SOLN: INTRAVENOUS | Status: DC | PRN

## 2019-10-06 MED ORDER — METHYLPREDNISOLONE SODIUM SUCC 125 MG IJ SOLR: 125.0000 mg | Freq: Once | INTRAMUSCULAR | Status: DC | PRN

## 2019-10-06 NOTE — Discharge Instructions (Signed)

## 2019-10-06 NOTE — Progress Notes (Signed)
  Diagnosis: COVID-19  Physician:  Dr. Patrick Wright  Procedure: Covid Infusion Clinic Med: casirivimab\imdevimab infusion - Provided patient with casirivimab\imdevimab fact sheet for patients, parents and caregivers prior to infusion.  Complications: No immediate complications noted.  Discharge: Discharged home   Myrella Fahs G [place] 10/06/2019  

## 2019-10-10 ENCOUNTER — Encounter (HOSPITAL_COMMUNITY): Payer: Self-pay | Admitting: Emergency Medicine

## 2019-10-10 ENCOUNTER — Emergency Department (HOSPITAL_COMMUNITY)
Admission: EM | Admit: 2019-10-10 | Discharge: 2019-10-10 | Disposition: A | Discharge status: Home / Self Care | Payer: 59 | Attending: Emergency Medicine | Admitting: Emergency Medicine

## 2019-10-10 ENCOUNTER — Other Ambulatory Visit: Payer: Self-pay

## 2019-10-10 ENCOUNTER — Emergency Department (HOSPITAL_COMMUNITY)
Admission: EM | Admit: 2019-10-10 | Discharge: 2019-10-10 | Disposition: A | Discharge status: Home / Self Care | Payer: 59 | Source: Home / Self Care | Attending: Emergency Medicine | Admitting: Emergency Medicine

## 2019-10-10 DIAGNOSIS — G4700 Insomnia, unspecified: Secondary | ICD-10-CM | POA: Insufficient documentation

## 2019-10-10 DIAGNOSIS — R630 Anorexia: Secondary | ICD-10-CM | POA: Insufficient documentation

## 2019-10-10 DIAGNOSIS — F519 Sleep disorder not due to a substance or known physiological condition, unspecified: Secondary | ICD-10-CM | POA: Diagnosis present

## 2019-10-10 DIAGNOSIS — U071 COVID-19: Secondary | ICD-10-CM

## 2019-10-10 DIAGNOSIS — Z5321 Procedure and treatment not carried out due to patient leaving prior to being seen by health care provider: Secondary | ICD-10-CM | POA: Insufficient documentation

## 2019-10-10 MED ORDER — HYDROXYZINE HCL 25 MG PO TABS
25.0000 mg | ORAL_TABLET | Freq: Every evening | ORAL | 0 refills | Status: DC | PRN
Start: 2019-10-10 — End: 2020-03-23

## 2019-10-10 NOTE — ED Notes (Signed)
Pt called x 3  No answer. 

## 2019-10-10 NOTE — Discharge Instructions (Addendum)
Please do not return to work until your symptoms resolved. You can take hydroxyzine at bedtime to help with sleep. Dry mouth is a common side effect of the COVID-19 illness.  Stay hydrated. Follow with your primary care provider symptoms persist.  No vuelva a trabajar hasta que sus sntomas hayan desaparecido. Puede tomar hidroxicina a la hora de acostarse para ayudarlo a Financial trader sueo. La boca seca es un efecto secundario comn de la enfermedad COVID-19. Mantente hidratado. Siga con su proveedor de Marine scientist. Los sntomas persisten.

## 2019-10-10 NOTE — ED Notes (Signed)
Pt stated he did not want to wait and left the building  

## 2019-10-10 NOTE — ED Provider Notes (Signed)
[facility]-EMERGENCY DEPT Provider Note   CSN: [id number] Arrival date & time: 10/10/19  1254     History Chief Complaint  Patient presents with  . Covid positive  . can't sleep  . no appetite    Rodney Mccann is a 39 y.o. male presenting to the ED with symptoms of insomnia and poor appetite in the setting of COVID-19 illness.  Patient tested positive on 10/02/2019, symptoms started on 09/29/2019.  He received the monoclonal antibody infusion on 10/06/2019.  He states cough has resolved, however he began having insomnia and poor appetite over the last 2 days.  He is wondering if this is a side effect from the infusion.  He also reports dry mouth.  Denies chest pain, shortness of breath, nausea or vomiting.  He states he was unable to sleep the last 2 nights, and felt drowsy at work yesterday.  He works in [employer] representative.  He has not treated these new symptoms with any over-the-counter medications.  The history is provided by the patient and medical records.       History reviewed. No pertinent past medical history.  There are no problems to display for this patient.   History reviewed. No pertinent surgical history.     Family History  Problem Relation Age of Onset  . Diabetes Mother   . Diabetes Father     Social History   Tobacco Use  . Smoking status: Never Smoker  . Smokeless tobacco: Never Used  Substance Use Topics  . Alcohol use: Yes    Comment: occasionally  . Drug use: No    Home Medications Prior to Admission medications   Medication Sig Start Date End Date Taking? Authorizing Provider  clobetasol (TEMOVATE) 0.05 % GEL Use daily for one week then once a week - for scalp 04/25/19   Eustace Moore, MD  hydrOXYzine (ATARAX/VISTARIL) 25 MG tablet Take 1 tablet (25 mg total) by mouth at bedtime as needed. 10/10/19   Kenechukwu Eckstein, [place] N, PA-C  loperamide (IMODIUM) 2 MG capsule Take 1 capsule (2 mg total) by mouth 4 (four) times daily as  needed for diarrhea or loose stools. 04/26/19   Eustace Moore, MD  triamcinolone ointment (KENALOG) 0.1 % Apply 1 application topically 2 (two) times daily. Patient not taking: Reported on 04/26/2019 04/25/19   Eustace Moore, MD    Allergies    Ibuprofen  Review of Systems   Review of Systems  All other systems reviewed and are negative.   Physical Exam Updated Vital Signs BP 122/86 (BP Location: Right Arm)   Pulse 66   Temp 98.2 F (36.8 C) (Oral)   Resp 16   SpO2 99%   Physical Exam Vitals and nursing note reviewed.  Constitutional:      General: He is not in acute distress.    Appearance: He is well-developed. He is not ill-appearing.  HENT:     Head: Normocephalic and atraumatic.  Eyes:     Conjunctiva/sclera: Conjunctivae normal.  Cardiovascular:     Rate and Rhythm: Normal rate and regular rhythm.  Pulmonary:     Effort: Pulmonary effort is normal. No respiratory distress.     Breath sounds: Normal breath sounds.  Neurological:     Mental Status: He is alert.  Psychiatric:        Mood and Affect: Mood normal.        Behavior: Behavior normal.     ED Results / Procedures / Treatments  Labs (all labs ordered are listed, but only abnormal results are displayed) Labs Reviewed - No data to display  EKG None  Radiology No results found.  Procedures Procedures (including critical care time)  Medications Ordered in ED Medications - No data to display  ED Course  I have reviewed the triage vital signs and the nursing notes.  Pertinent labs & imaging results that were available during my care of the patient were reviewed by me and considered in my medical decision making (see chart for details).    MDM Rules/Calculators/A&P                          Patient with known COVID-19 illness, presenting with difficulty sleeping, poor appetite and dry mouth.  He had received monoclonal infusion on Thursday.  Symptoms began 2 days after.  Denies chest  pain, shortness of breath, persisting cough.  He is not treated symptoms with any medications.  On exam, he is in no distress.  Discussed importance of hydration.  Prescribed hydroxyzine for sleep.  Instructed to follow-up with PCP symptoms persist.  He is also instructed to remain at home in isolation given his current COVID-19 illness and is likely still contagious if continuing to have symptoms, is only 11 days since symptom onset.    Final Clinical Impression(s) / ED Diagnoses Final diagnoses:  COVID-19    Rx / DC Orders ED Discharge Orders         Ordered    hydrOXYzine (ATARAX/VISTARIL) 25 MG tablet  At bedtime PRN     Discontinue  Reprint     10/10/19 1515           Ferrin Liebig, [place] N, [place] 10/10/19 1613    Linwood Dibbles, MD 10/11/19 1036

## 2019-10-10 NOTE — ED Triage Notes (Signed)
Pt covid+ and states that is afraid to sleep and can't sleep, has no appetite. Pt reports had infusion done last week on Thursday. Reports stopped coughing. Reports has been drinking water.

## 2019-10-10 NOTE — ED Triage Notes (Signed)
Pt tested +covid on 8/8, reports his symptoms have not improved and he is having trouble sleeping.

## 2019-10-15 DIAGNOSIS — Z8616 Personal history of COVID-19: Secondary | ICD-10-CM | POA: Insufficient documentation

## 2020-03-23 ENCOUNTER — Other Ambulatory Visit: Payer: Self-pay

## 2020-03-23 ENCOUNTER — Ambulatory Visit: Payer: Self-pay | Admitting: Family Medicine

## 2020-03-23 ENCOUNTER — Encounter: Payer: Self-pay | Admitting: Family Medicine

## 2020-03-23 DIAGNOSIS — Z8616 Personal history of COVID-19: Secondary | ICD-10-CM

## 2020-03-23 DIAGNOSIS — Z113 Encounter for screening for infections with a predominantly sexual mode of transmission: Secondary | ICD-10-CM

## 2020-03-23 LAB — GRAM STAIN

## 2020-03-23 NOTE — Progress Notes (Signed)
Post: RN reviewed gram stain with patient with Language Line. No tx per S.O.  Provider orders complete.   Harvie Heck, RN

## 2020-03-23 NOTE — Progress Notes (Signed)
[facility] Department STI clinic/screening visit  Subjective:  Rodney Mccann is a 40 y.o. male being seen today for an STI screening visit. The patient reports they do have symptoms.    Patient has the following medical conditions:   Patient Active Problem List   Diagnosis Date Noted  . History of COVID-19 10/15/2019  . Hypertriglyceridemia 11/26/2015  . Dandruff 11/26/2015     Chief Complaint  Patient presents with  . SEXUALLY TRANSMITTED DISEASE    STD screening    HPI  Patient reports itching in penis, inside penis feels tingling. No pain with urination. Will wake 3-4 times per night to urinate.  Reports bad odor to urine.   See flowsheet for further details and programmatic requirements.    The following portions of the patient's history were reviewed and updated as appropriate: allergies, current medications, past medical history, past social history, past surgical history and problem list.  Objective:  There were no vitals filed for this visit.  Physical Exam Constitutional:      Appearance: Normal appearance.  HENT:     Head: Normocephalic and atraumatic.     Comments: No nits or hair loss    Mouth/Throat:     Mouth: Mucous membranes are moist.     Pharynx: Oropharynx is clear. No oropharyngeal exudate or posterior oropharyngeal erythema.  Pulmonary:     Effort: Pulmonary effort is normal.  Chest:  Breasts:     Right: No axillary adenopathy or supraclavicular adenopathy.     Left: No axillary adenopathy or supraclavicular adenopathy.    Abdominal:     General: Abdomen is flat.     Palpations: Abdomen is soft. There is no hepatomegaly or mass.     Tenderness: There is no abdominal tenderness.  Genitourinary:    Pubic Area: No rash or pubic lice.      Penis: Normal.      Testes: Normal.     Epididymis:     Right: Normal.     Left: Normal.     Rectum: Normal.     Comments: uncircumsized male. Head of penis is wet/moist. No apparent  discharge. No testicular pain or lesions. No rash noted.  Lymphadenopathy:     Head:     Right side of head: No preauricular or posterior auricular adenopathy.     Left side of head: No preauricular or posterior auricular adenopathy.     Cervical: No cervical adenopathy.     Upper Body:     Right upper body: No supraclavicular or axillary adenopathy.     Left upper body: No supraclavicular or axillary adenopathy.     Lower Body: No right inguinal adenopathy. No left inguinal adenopathy.  Skin:    General: Skin is warm and dry.     Findings: No rash.  Neurological:     Mental Status: He is alert and oriented to person, place, and time.       Assessment and Plan:  Rodney Mccann is a 40 y.o. male presenting to the [facility] Department for STI screening   1. Screening examination for venereal disease Treat gram stain per standing Reviewed condom use Reviewed return time of state lab tests  Discussed importance of seeing a PCP for diabetes check or UTI check if sx continue. Patient with strong family history of DM and per review of record was diagnosed with this but he denied any medical problems to me and I cannot find a HA1C that would confirm  this diagnosis.  - Gram stain - HIV [facility] - Syphilis Serology, [facility] - Gonococcus culture   Return if symptoms worsen or fail to improve.  No future appointments.  Federico Flake, MD

## 2020-03-29 LAB — GONOCOCCUS CULTURE

## 2020-11-30 ENCOUNTER — Other Ambulatory Visit (HOSPITAL_BASED_OUTPATIENT_CLINIC_OR_DEPARTMENT_OTHER): Payer: Self-pay | Admitting: Internal Medicine

## 2020-11-30 ENCOUNTER — Other Ambulatory Visit: Payer: Self-pay | Admitting: Internal Medicine

## 2020-11-30 DIAGNOSIS — R7989 Other specified abnormal findings of blood chemistry: Secondary | ICD-10-CM

## 2020-11-30 DIAGNOSIS — R1031 Right lower quadrant pain: Secondary | ICD-10-CM

## 2020-12-11 ENCOUNTER — Ambulatory Visit
Admission: RE | Admit: 2020-12-11 | Discharge: 2020-12-11 | Disposition: A | Discharge status: Home / Self Care | Payer: 59 | Source: Ambulatory Visit | Attending: Internal Medicine | Admitting: Internal Medicine

## 2020-12-11 ENCOUNTER — Other Ambulatory Visit: Payer: Self-pay

## 2020-12-11 DIAGNOSIS — R7989 Other specified abnormal findings of blood chemistry: Secondary | ICD-10-CM | POA: Diagnosis not present

## 2020-12-11 DIAGNOSIS — R1031 Right lower quadrant pain: Secondary | ICD-10-CM | POA: Diagnosis present

## 2020-12-11 MED ORDER — IOHEXOL 300 MG/ML  SOLN
100.0000 mL | Freq: Once | INTRAMUSCULAR | Status: AC | PRN
  Administered 2020-12-11: 100 mL via INTRAVENOUS
